# Patient Record
Sex: Female | Born: 2000 | Race: White | Hispanic: No
Health system: Southern US, Community
[De-identification: ages and names within clinical notes are randomized; demographics above are authoritative.]

## PROBLEM LIST (undated history)

## (undated) DIAGNOSIS — N63 Unspecified lump in unspecified breast: Secondary | ICD-10-CM

## (undated) DIAGNOSIS — R51 Headache: Secondary | ICD-10-CM

## (undated) DIAGNOSIS — R519 Headache, unspecified: Secondary | ICD-10-CM

## (undated) HISTORY — PX: NO PAST SURGERIES: SHX2092

## (undated) HISTORY — PX: IUD REMOVAL: SHX5392

---
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
## 2021-03-02 ENCOUNTER — Encounter: Payer: PRIVATE HEALTH INSURANCE | Attending: Obstetrics and Gynecology | Admitting: Registered"
